# Patient Record
Sex: Male | Born: 2000 | Race: White | Hispanic: No | Marital: Single | Smoking: Never smoker
Health system: Southern US, Community
[De-identification: ages and names within clinical notes are randomized; demographics above are authoritative.]

## PROBLEM LIST (undated history)

## (undated) DIAGNOSIS — F909 Attention-deficit hyperactivity disorder, unspecified type: Secondary | ICD-10-CM

## (undated) DIAGNOSIS — F32A Depression, unspecified: Secondary | ICD-10-CM

## (undated) DIAGNOSIS — F419 Anxiety disorder, unspecified: Secondary | ICD-10-CM

## (undated) DIAGNOSIS — F329 Major depressive disorder, single episode, unspecified: Secondary | ICD-10-CM

## (undated) HISTORY — PX: TONSILLECTOMY: SUR1361

## (undated) HISTORY — DX: Major depressive disorder, single episode, unspecified: F32.9

## (undated) HISTORY — PX: TYMPANOSTOMY TUBE PLACEMENT: SHX32

## (undated) HISTORY — DX: Anxiety disorder, unspecified: F41.9

## (undated) HISTORY — DX: Depression, unspecified: F32.A

---
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
## 2021-07-18 ENCOUNTER — Other Ambulatory Visit: Payer: Self-pay

## 2021-07-18 DIAGNOSIS — S42022A Displaced fracture of shaft of left clavicle, initial encounter for closed fracture: Secondary | ICD-10-CM | POA: Diagnosis not present

## 2021-07-18 DIAGNOSIS — S0101XA Laceration without foreign body of scalp, initial encounter: Secondary | ICD-10-CM | POA: Insufficient documentation

## 2021-07-18 DIAGNOSIS — Z23 Encounter for immunization: Secondary | ICD-10-CM | POA: Insufficient documentation

## 2021-07-18 DIAGNOSIS — S4992XA Unspecified injury of left shoulder and upper arm, initial encounter: Secondary | ICD-10-CM | POA: Diagnosis not present

## 2021-07-18 NOTE — ED Notes (Signed)
Pt refused C-collar in triage.
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
## 2021-07-19 ENCOUNTER — Emergency Department (HOSPITAL_BASED_OUTPATIENT_CLINIC_OR_DEPARTMENT_OTHER): Payer: No Typology Code available for payment source | Admitting: Radiology

## 2021-07-19 ENCOUNTER — Emergency Department (HOSPITAL_BASED_OUTPATIENT_CLINIC_OR_DEPARTMENT_OTHER)
Admission: EM | Admit: 2021-07-19 | Discharge: 2021-07-19 | Disposition: A | Discharge status: Home / Self Care | Payer: No Typology Code available for payment source | Attending: Emergency Medicine | Admitting: Emergency Medicine

## 2021-07-19 DIAGNOSIS — S42022A Displaced fracture of shaft of left clavicle, initial encounter for closed fracture: Secondary | ICD-10-CM

## 2021-07-19 DIAGNOSIS — S0101XA Laceration without foreign body of scalp, initial encounter: Secondary | ICD-10-CM

## 2021-07-19 MED ORDER — TETANUS-DIPHTH-ACELL PERTUSSIS 5-2.5-18.5 LF-MCG/0.5 IM SUSY
0.5000 mL | PREFILLED_SYRINGE | Freq: Once | INTRAMUSCULAR | Status: AC
  Administered 2021-07-19: 0.5 mL via INTRAMUSCULAR
  Filled 2021-07-19: qty 0.5
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
## 2021-07-28 DIAGNOSIS — F411 Generalized anxiety disorder: Secondary | ICD-10-CM | POA: Diagnosis not present

## 2021-07-28 DIAGNOSIS — Z113 Encounter for screening for infections with a predominantly sexual mode of transmission: Secondary | ICD-10-CM | POA: Diagnosis not present

## 2021-07-28 DIAGNOSIS — F909 Attention-deficit hyperactivity disorder, unspecified type: Secondary | ICD-10-CM | POA: Diagnosis not present

## 2021-07-28 DIAGNOSIS — S42022A Displaced fracture of shaft of left clavicle, initial encounter for closed fracture: Secondary | ICD-10-CM | POA: Diagnosis not present

## 2021-07-28 DIAGNOSIS — Z7251 High risk heterosexual behavior: Secondary | ICD-10-CM | POA: Diagnosis not present

## 2021-08-29 DIAGNOSIS — K011 Impacted teeth: Secondary | ICD-10-CM | POA: Diagnosis not present

## 2021-09-01 DIAGNOSIS — F909 Attention-deficit hyperactivity disorder, unspecified type: Secondary | ICD-10-CM | POA: Diagnosis not present

## 2021-09-01 DIAGNOSIS — F411 Generalized anxiety disorder: Secondary | ICD-10-CM | POA: Diagnosis not present

## 2021-09-11 DIAGNOSIS — Z4789 Encounter for other orthopedic aftercare: Secondary | ICD-10-CM | POA: Diagnosis not present

## 2021-12-17 DIAGNOSIS — T8484XA Pain due to internal orthopedic prosthetic devices, implants and grafts, initial encounter: Secondary | ICD-10-CM | POA: Diagnosis not present

## 2021-12-17 DIAGNOSIS — Z967 Presence of other bone and tendon implants: Secondary | ICD-10-CM | POA: Diagnosis not present

## 2022-03-23 DIAGNOSIS — L503 Dermatographic urticaria: Secondary | ICD-10-CM | POA: Diagnosis not present

## 2022-04-15 DIAGNOSIS — L503 Dermatographic urticaria: Secondary | ICD-10-CM | POA: Diagnosis not present

## 2022-10-19 IMAGING — DX DG CLAVICLE*L*
2 series · 2 of 2 positions shown · non-contrast
Comparison: None Available.

CLINICAL DATA: Left shoulder pain after a fall

EXAM:
LEFT CLAVICLE - 2+ VIEWS

[clavicle ap]
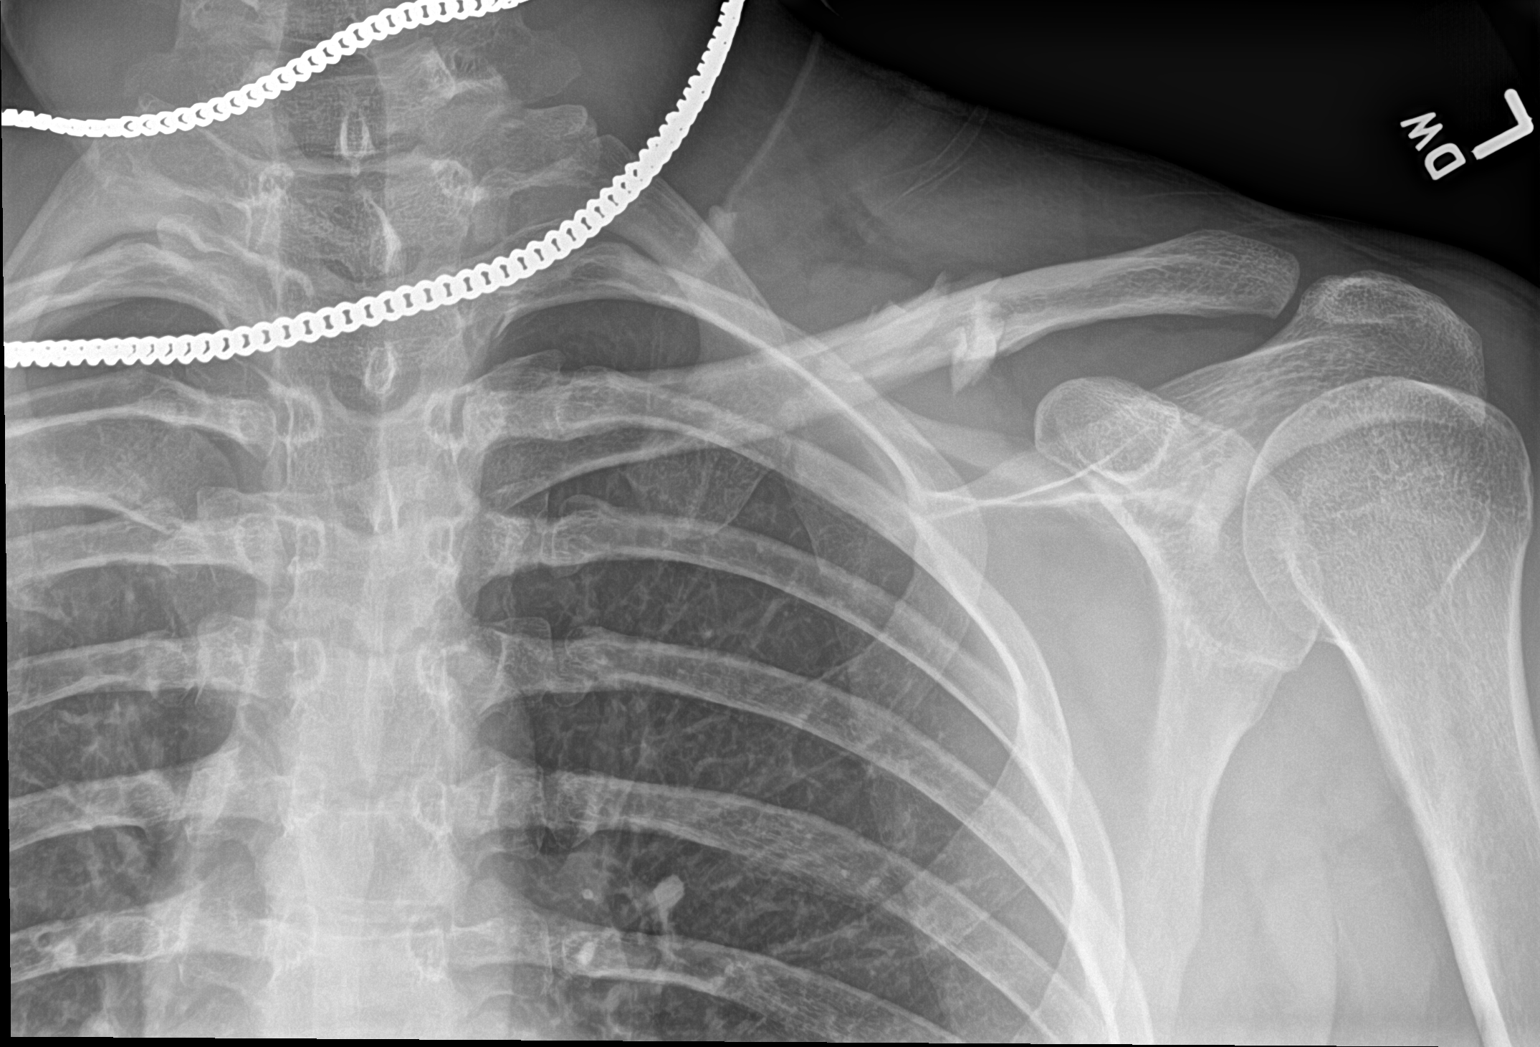

[clavicle axial]
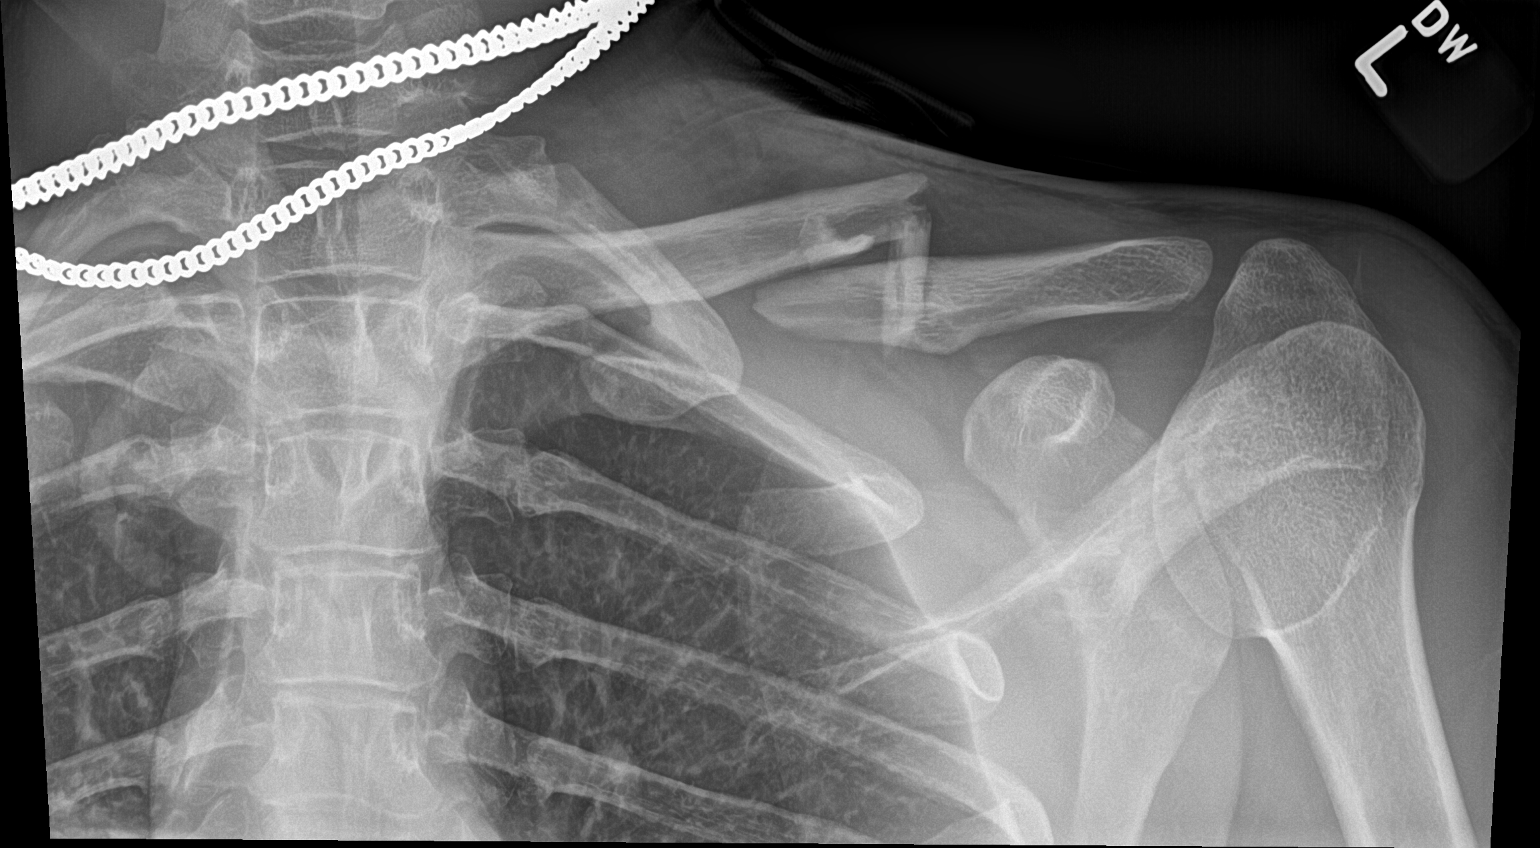

[2 of 2 positions shown; findings below may reference images not displayed]

FINDINGS: Comminuted fractures of the midshaft left clavicle with full shaft
width inferior displacement and 3.5 cm overriding of the distal
fracture fragment. Displaced butterfly fragments are present.
Coracoclavicular and acromioclavicular spaces are normal. Visualized
ribs appear intact.
IMPRESSION: Comminuted displaced fractures of the midshaft left clavicle.

## 2023-01-27 DIAGNOSIS — W268XXA Contact with other sharp object(s), not elsewhere classified, initial encounter: Secondary | ICD-10-CM | POA: Diagnosis not present

## 2023-01-27 DIAGNOSIS — S61210A Laceration without foreign body of right index finger without damage to nail, initial encounter: Secondary | ICD-10-CM | POA: Diagnosis not present

## 2023-04-08 DIAGNOSIS — R000 Tachycardia, unspecified: Secondary | ICD-10-CM | POA: Diagnosis not present

## 2023-04-27 DIAGNOSIS — R000 Tachycardia, unspecified: Secondary | ICD-10-CM | POA: Diagnosis not present

## 2023-06-17 ENCOUNTER — Encounter: Payer: Self-pay | Admitting: Cardiovascular Disease

## 2023-06-17 ENCOUNTER — Ambulatory Visit: Attending: Cardiovascular Disease | Admitting: Cardiovascular Disease

## 2023-06-17 VITALS — BP 124/94 | HR 73 | Ht 68.75 in | Wt 195.0 lb

## 2023-06-17 DIAGNOSIS — I479 Paroxysmal tachycardia, unspecified: Secondary | ICD-10-CM | POA: Diagnosis not present

## 2023-06-17 DIAGNOSIS — I472 Ventricular tachycardia, unspecified: Secondary | ICD-10-CM | POA: Diagnosis not present

## 2023-06-17 DIAGNOSIS — I4892 Unspecified atrial flutter: Secondary | ICD-10-CM | POA: Diagnosis not present

## 2023-06-17 MED ORDER — NADOLOL 40 MG PO TABS: 40.0000 mg | ORAL_TABLET | Freq: Every day | ORAL | 3 refills | Status: DC

## 2023-06-17 NOTE — Patient Instructions (Addendum)
 Medication Instructions:  START Nadolol 40 mg once daily  *If you need a refill on your cardiac medications before your next appointment, please call your pharmacy*   Testing/Procedures: Echocardiogram  Your physician has requested that you have an echocardiogram. Echocardiography is a painless test that uses sound waves to create images of your heart. It provides your doctor with information about the size and shape of your heart and how well your heart's chambers and valves are working. This procedure takes approximately one hour. There are no restrictions for this procedure. Please do NOT wear cologne, perfume, aftershave, or lotions (deodorant is allowed). Please arrive 15 minutes prior to your appointment time.  Please note: We ask at that you not bring children with you during ultrasound (echo/ vascular) testing. Due to room size and safety concerns, children are not allowed in the ultrasound rooms during exams. Our front office staff cannot provide observation of children in our lobby area while testing is being conducted. An adult accompanying a patient to their appointment will only be allowed in the ultrasound room at the discretion of the ultrasound technician under special circumstances. We apologize for any inconvenience.  Treadmill Stress Test Your physician has requested that you have an exercise tolerance test. For further information please visit [email or web address] Please also follow instruction sheet, as given.   Follow-Up: At [facility], you and your health needs are our priority.  As part of our continuing mission to provide you with exceptional heart care, our providers are all part of one team.  This team includes your primary Cardiologist (physician) and Advanced Practice Providers or APPs (Physician Assistants and Nurse Practitioners) who all work together to provide you with the care you need, when you need it.  Your next appointment:   6 - 8 weeks (please  ensure its scheduled after both tests)  Provider:   Marlane Silver, MD       You are scheduled for an Exercise Stress Test on ___________________  Please arrive 15 minutes prior to your appointment time for registration and insurance purposes.  The test will take approximately 45 minutes to complete.  How to prepare for your Exercise Stress Test: Do bring a list of your current medications with you.  If not listed below, you may take your medications as normal. Do not take nadolol (Corgard) for 2 days prior to the test.  Bring the medication to your appointment as you may be required to take it once the test is complete. Do wear comfortable clothes (no dresses or overalls) and walking shoes, tennis shoes preferred (no heels or open toed shoes are allowed) Do Not wear cologne, perfume, aftershave or lotions (deodorant is allowed).  If these instructions are not followed, your test will have to be rescheduled.  If you have questions or concerns about your appointment, you can call the Stress Lab at [phone].  If you cannot keep your appointment, please provide 24 hours notification to the Stress Lab, to avoid a possible $50 charge to your account.

## 2023-06-17 NOTE — Progress Notes (Signed)
 Electrophysiology Office Note:    Date:  06/17/2023   ID:  Scott Beck, DOB 03-21-2000, MRN [id number]  PCP:  Elyce Hams, Marguerita Shih, MD   [facility] Providers Cardiologist:  None     Referring MD: Elida Grounds, DO   History of Present Illness:    Scott Beck is a 23 y.o. male with a medical history significant for ADHD, Anxiety, referred for management of tachycardia.     Discussed the use of AI scribe software for clinical note transcription with the patient, who gave verbal consent to proceed.  History of Present Illness Scott Beck is a 23 year old male who presents with episodes of rapid heart rate and palpitations. He was referred by [facility] for evaluation of heart rhythm abnormalities.  He has been experiencing episodes of rapid heart rate for approximately three years, characterized by palpitations often triggered by stress or anxiety. A severe episode in early March led to the placement of a heart monitor, during which his heart rate reached up to 200 beats per minute. These episodes typically last up to an hour and can be alleviated by calming himself down.  He experiences sharp chest pains on a few occasions, with the most recent occurring today. These pains are brief, lasting about a second, and are not associated with the palpitations.  He has a history of ADD and anxiety, with social anxiety being a significant component. He was previously treated with citalopram  for anxiety and took Vyvanse  for ADD during his school years, but he has not been on these medications for several years.  He reports increased anxiety and stress at work, particularly due to new management and extended work hours. He works at a [employer] and is considering a career change to reduce stress.  No recent episodes of rapid heart rate, passing out, or significant lightheadedness. Occasionally feels lightheaded during episodes of rapid heart rate but  has never fainted.         Today, he has no complaints and reports that he feels well.  EKGs/Labs/Other Studies Reviewed Today:     Echocardiogram:  TTE Ordered - Results pending   Monitors:  5-day monitor March 2025-- my interpretation Sinus rhythm heart rate 45 - 242 bpm, average 86 bpm There were multiple tachycardia episodes.  Rates up to approaching 250 bpm, often with aberrancy.  Tachycardia is  though there is some RR variability with warm up and slow down.  Stress testing:  Routine treadmill stress Ordered - results pending    EKG:   EKG Interpretation Date/Time:  Thursday Jun 17 2023 15:32:11 EDT Ventricular Rate:  73 PR Interval:  136 QRS Duration:  104 QT Interval:  396 QTC Calculation: 436 R Axis:   61  Text Interpretation: Normal sinus rhythm Normal ECG No previous ECGs available Confirmed by Marlane Silver [phone]) on 06/17/2023 4:00:33 PM     Physical Exam:    VS:  BP (!) 124/94 (BP Location: Left Arm, Patient Position: Sitting, Cuff Size: Large)   Pulse 73   Ht 5' 8.75" (1.746 m)   Wt 195 lb (88.5 kg)   SpO2 95%   BMI 29.01 kg/m     Wt Readings from Last 3 Encounters:  06/17/23 195 lb (88.5 kg)  09/13/18 167 lb (75.8 kg) (75%, Z= 0.67)*  08/08/18 167 lb (75.8 kg) (75%, Z= 0.69)*   * Growth percentiles are based on CDC (Boys, 2-20 Years) data.     GEN:  Well nourished, well developed in no acute distress CARDIAC: RRR, no murmurs, rubs, gallops RESPIRATORY:  Normal work of breathing MUSCULOSKELETAL: no edema    ASSESSMENT & PLAN:     Paroxysmal tachycardia Appears to be an automatic tachycardia - both atrial tachycardia and potentially brief ventricular tachycardia, though I think most of the wide complex tachycardia seen is atrial tachycardia with aberrancy Occurs during periods of stress, high adrenergic state Start nadalol 40 daily -- may switch to PRN if testing is normal Order stress test, TTE    Signed, Efraim Grange, MD  06/17/2023 5:42 PM     [facility]

## 2023-06-29 ENCOUNTER — Telehealth: Payer: Self-pay | Admitting: Cardiovascular Disease

## 2023-06-29 MED ORDER — NADOLOL 40 MG PO TABS: 40.0000 mg | ORAL_TABLET | Freq: Every day | ORAL | 3 refills | Status: AC

## 2023-06-29 NOTE — Telephone Encounter (Signed)
 Sent Nadolol  to patient's pharmacy of choice to [facility].

## 2023-06-29 NOTE — Addendum Note (Signed)
 Addended by: Reyes Caul, Denali Sharma L on: 06/29/2023 01:54 PM   Modules accepted: Orders

## 2023-06-29 NOTE — Telephone Encounter (Signed)
*  STAT* If patient is at the pharmacy, call can be transferred to refill team.   1. Which medications need to be refilled? (please list name of each medication and dose if known)   nadolol  (CORGARD ) 40 MG tablet    2. Which pharmacy/location (including street and city if local pharmacy) is medication to be sent to? [id number] - [place] ([place]), [place] - [address]    3. Do they need a 30 day or 90 day supply?  90 day supply  Patient would like to have prescription transferred from [facility] to [facility].

## 2023-07-05 ENCOUNTER — Telehealth (HOSPITAL_COMMUNITY): Payer: Self-pay

## 2023-07-13 ENCOUNTER — Ambulatory Visit (HOSPITAL_COMMUNITY)
Admission: RE | Admit: 2023-07-13 | Discharge: 2023-07-13 | Disposition: A | Discharge status: Home / Self Care | Source: Ambulatory Visit | Attending: Cardiovascular Disease | Admitting: Cardiovascular Disease

## 2023-07-13 DIAGNOSIS — I479 Paroxysmal tachycardia, unspecified: Secondary | ICD-10-CM

## 2023-07-16 ENCOUNTER — Encounter (HOSPITAL_COMMUNITY): Payer: Self-pay | Admitting: *Deleted

## 2023-07-19 ENCOUNTER — Telehealth (HOSPITAL_COMMUNITY): Payer: Self-pay | Admitting: *Deleted

## 2023-07-19 NOTE — Telephone Encounter (Signed)
 Reminder and instruction letter for upcoming ETT sent via [employer].

## 2023-07-22 ENCOUNTER — Ambulatory Visit (HOSPITAL_COMMUNITY)
Admission: RE | Admit: 2023-07-22 | Discharge: 2023-07-22 | Disposition: A | Discharge status: Home / Self Care | Source: Ambulatory Visit | Attending: Cardiology | Admitting: Cardiology

## 2023-07-22 DIAGNOSIS — I479 Paroxysmal tachycardia, unspecified: Secondary | ICD-10-CM | POA: Diagnosis not present

## 2023-07-22 LAB — ECHOCARDIOGRAM COMPLETE
Area-P 1/2: 5.46 cm2
S' Lateral: 2.9 cm

## 2023-07-23 ENCOUNTER — Ambulatory Visit: Payer: Self-pay | Admitting: Cardiovascular Disease

## 2023-07-26 ENCOUNTER — Ambulatory Visit (HOSPITAL_COMMUNITY)
Admission: RE | Admit: 2023-07-26 | Discharge: 2023-07-26 | Disposition: A | Discharge status: Home / Self Care | Source: Ambulatory Visit | Attending: Cardiovascular Disease | Admitting: Cardiovascular Disease

## 2023-07-26 DIAGNOSIS — I479 Paroxysmal tachycardia, unspecified: Secondary | ICD-10-CM | POA: Diagnosis not present

## 2023-07-26 LAB — EXERCISE TOLERANCE TEST
Angina Index: 0
Duke Treadmill Score: 11
Estimated workload: 12.5
Exercise duration (min): 10 min
Exercise duration (sec): 31 s
MPHR: 198 {beats}/min
Peak HR: 171 {beats}/min
Percent HR: 86 %
Rest HR: 94 {beats}/min
ST Depression (mm): 0 mm

## 2023-07-27 ENCOUNTER — Ambulatory Visit: Attending: Cardiovascular Disease | Admitting: Cardiovascular Disease

## 2023-07-27 ENCOUNTER — Encounter: Payer: Self-pay | Admitting: Cardiovascular Disease

## 2023-07-27 ENCOUNTER — Ambulatory Visit: Admitting: Cardiovascular Disease

## 2023-07-27 VITALS — BP 104/60 | HR 72 | Ht 70.0 in | Wt 204.0 lb

## 2023-07-27 DIAGNOSIS — I479 Paroxysmal tachycardia, unspecified: Secondary | ICD-10-CM | POA: Diagnosis not present

## 2023-07-27 NOTE — Patient Instructions (Signed)

## 2023-07-27 NOTE — Progress Notes (Signed)
 Electrophysiology Office Note:    Date:  07/27/2023   ID:  Scott Beck, DOB 10-Jul-2000, MRN [id number]  PCP:  Leonel Cole, MD   [facility] Providers Cardiologist:  None Electrophysiologist:  Eulas FORBES Furbish, MD     Referring MD: Melonie Colonel, Mikel HERO, *   History of Present Illness:    Scott Beck is a 23 y.o. male with a medical history significant for ADHD, Anxiety, referred for management of tachycardia.     Discussed the use of AI scribe software for clinical note transcription with the patient, who gave verbal consent to proceed.  History of Present Illness Scott Beck is a 23 year old male who presents with episodes of rapid heart rate and palpitations. He was referred by [facility] for evaluation of heart rhythm abnormalities.  He has been experiencing episodes of rapid heart rate for approximately three years, characterized by palpitations often triggered by stress or anxiety. A severe episode in early March led to the placement of a heart monitor, during which his heart rate reached up to 200 beats per minute. These episodes typically last up to an hour and can be alleviated by calming himself down.  He experiences sharp chest pains on a few occasions, with the most recent occurring today. These pains are brief, lasting about a second, and are not associated with the palpitations.  He has a history of ADD and anxiety, with social anxiety being a significant component. He was previously treated with citalopram  for anxiety and took Vyvanse  for ADD during his school years, but he has not been on these medications for several years.  He reports increased anxiety and stress at work, particularly due to new management and extended work hours. He works at a [employer] and is considering a career change to reduce stress.  No recent episodes of rapid heart rate, passing out, or significant lightheadedness. Occasionally feels  lightheaded during episodes of rapid heart rate but has never fainted.           Today, he has no complaints and reports that he feels well. He is here to discuss results of his stress test and echocardiogram. The nadolol  has helped with the palpitations.  EKGs/Labs/Other Studies Reviewed Today:     Echocardiogram:  TTE 07/2023 LVEF 60-65% no wall motion abnormalities.  Normal RV structure and function.  Normal valvular structure and function.   Monitors:  5-day monitor March 2025-- my interpretation Sinus rhythm heart rate 45 - 242 bpm, average 86 bpm There were multiple tachycardia episodes.  Rates up to approaching 250 bpm, often with aberrancy.  Tachycardia is  though there is some RR variability with warm up and slow down.  Stress testing:  Routine treadmill stress No arrhythmia -- normal stress response    EKG:   EKG Interpretation Date/Time:  Tuesday July 27 2023 09:46:30 EDT Ventricular Rate:  72 PR Interval:  130 QRS Duration:  100 QT Interval:  414 QTC Calculation: 453 R Axis:   49  Text Interpretation: Normal sinus rhythm with sinus arrhythmia Normal ECG When compared with ECG of 17-Jun-2023 15:32, No significant change was found Confirmed by Furbish Eulas [phone]) on 07/27/2023 9:52:17 AM     Physical Exam:    VS:  BP 104/60   Pulse 72   Ht 5' 10 (1.778 m)   Wt 204 lb (92.5 kg)   SpO2 96%   BMI 29.27 kg/m     Wt Readings from Last 3  Encounters:  07/27/23 204 lb (92.5 kg)  06/17/23 195 lb (88.5 kg)  09/13/18 167 lb (75.8 kg) (75%, Z= 0.67)*   * Growth percentiles are based on CDC (Boys, 2-20 Years) data.     GEN:  Well nourished, well developed in no acute distress CARDIAC: RRR, no murmurs, rubs, gallops RESPIRATORY:  Normal work of breathing MUSCULOSKELETAL: no edema    ASSESSMENT & PLAN:     Paroxysmal tachycardia Appears to be an automatic tachycardia - both atrial tachycardia and potentially brief ventricular tachycardia,  though I think most of the wide complex tachycardia seen is atrial tachycardia with aberrancy Occurs during periods of stress, high adrenergic state Continue nadolol  40mg    Will talk to PCP about resuming citalopram      Signed, Eulas FORBES Furbish, MD  07/27/2023 10:07 AM    [facility]
